# Patient Record
Sex: Male | Born: 2000 | Race: Black or African American | Hispanic: No | Marital: Single
Health system: Southern US, Community
[De-identification: ages and names within clinical notes are randomized; demographics above are authoritative.]

---
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
## 2020-10-15 IMAGING — CT CT PELVIS W/ CM
2 of 3 series · 16 of 46 positions shown, 18 images · IV contrast (OMNIPAQUE 300)
Comparison: None

CLINICAL DATA: Soft tissue mass/abscess along the buttock.

EXAM:
CT PELVIS WITH CONTRAST
TECHNIQUE: Multidetector CT imaging of the pelvis was performed using the
standard protocol following the bolus administration of intravenous
contrast.
CONTRAST:  100mL OMNIPAQUE IOHEXOL 300 MG/ML  SOLN

[Series 3: axial st · axial · 0.72mm/px · z∈[-132,+133]mm · 13 of 61 slices shown, 15 images]
[im 4/61  soft-tissue]
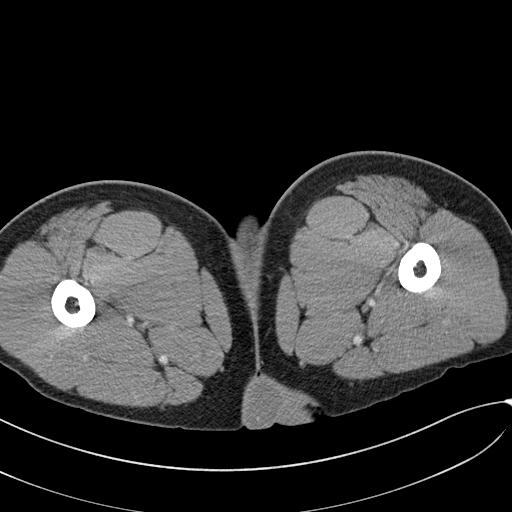
[im 4/61  bone]
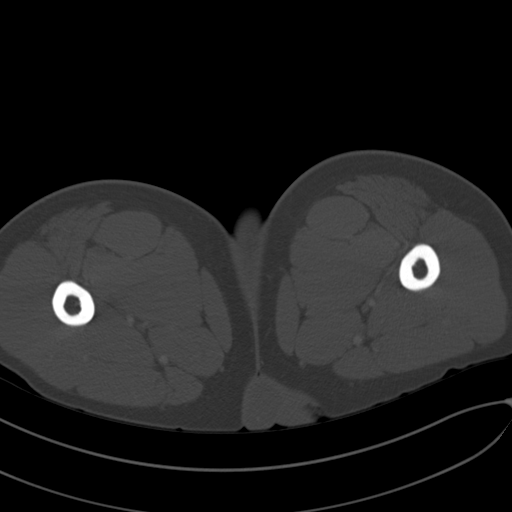
[im 8/61  soft-tissue]
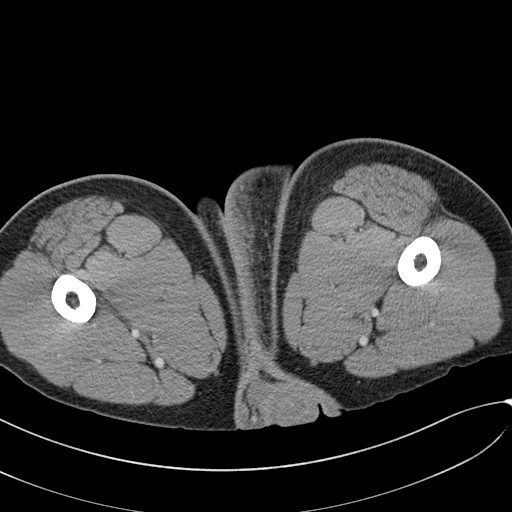
[im 12/61  soft-tissue]
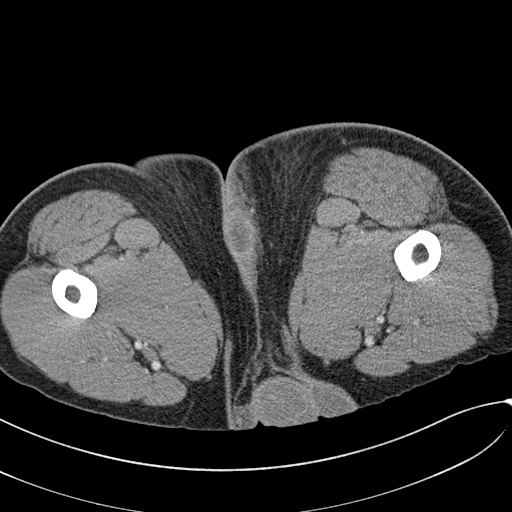
[im 18/61  soft-tissue]
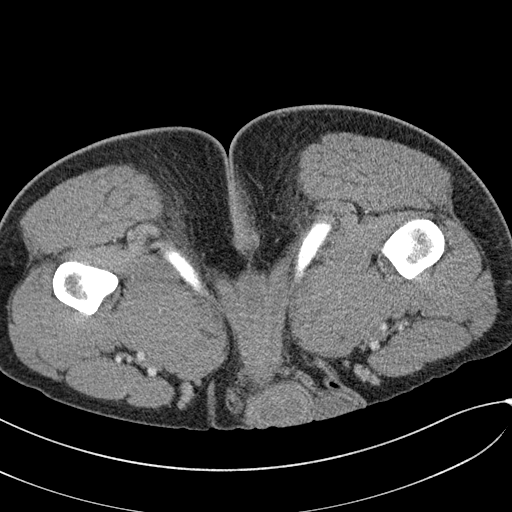
[im 22/61  soft-tissue]
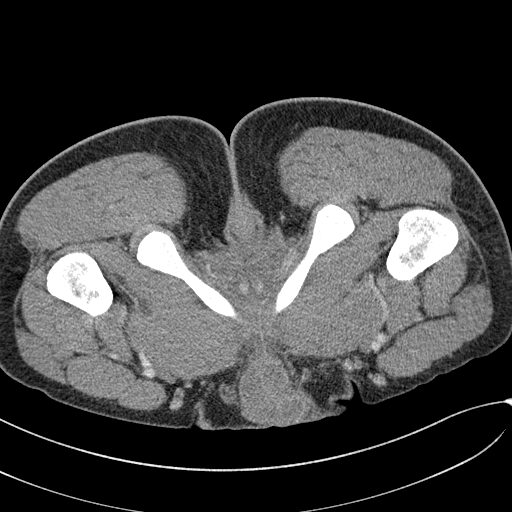
[im 26/61  soft-tissue]
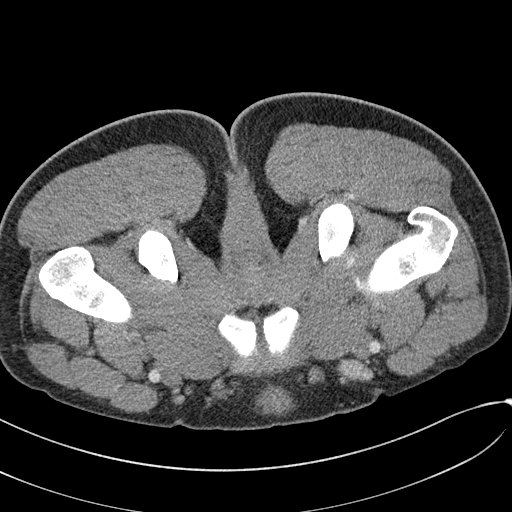
[im 31/61  soft-tissue]
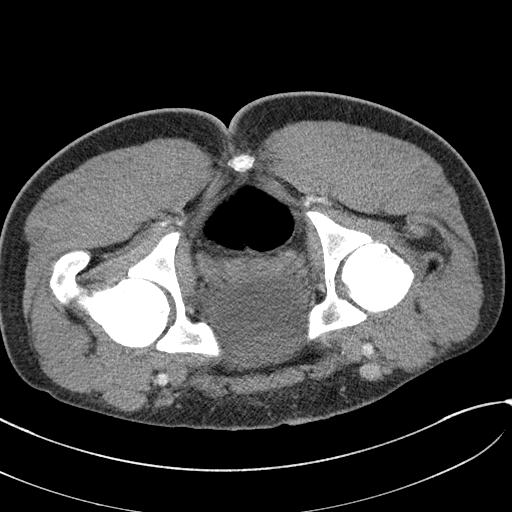
[im 35/61  soft-tissue]
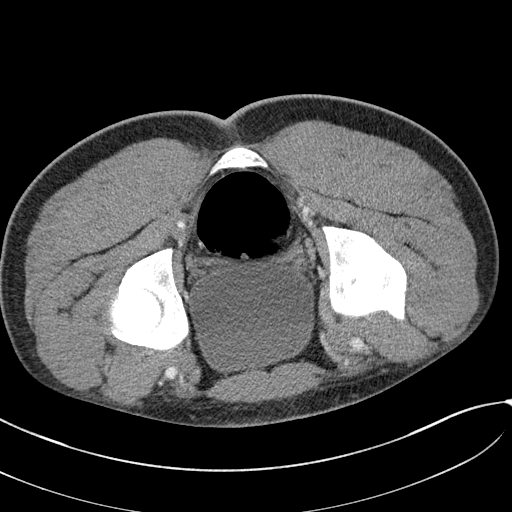
[im 39/61  soft-tissue]
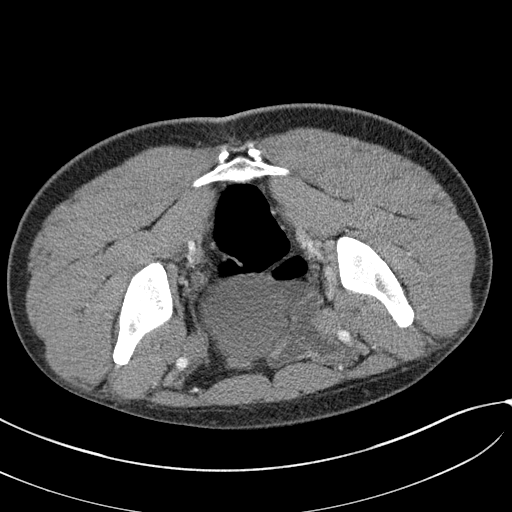
[im 39/61  bone]
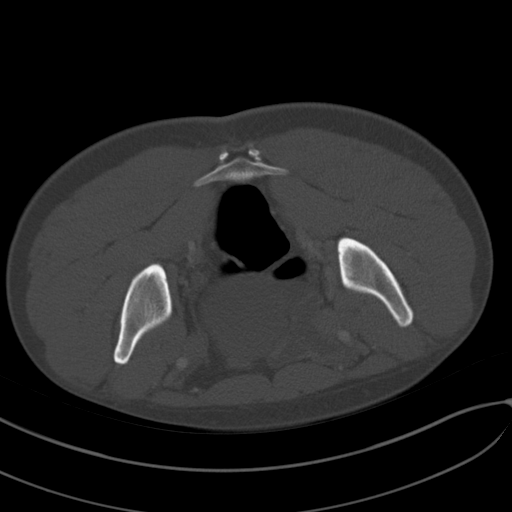
[im 43/61  soft-tissue]
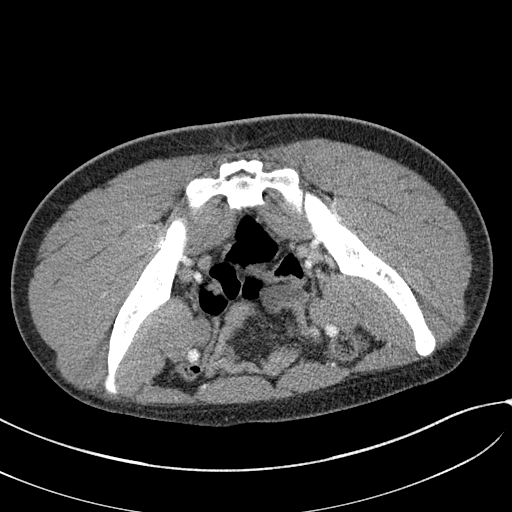
[im 49/61  soft-tissue]
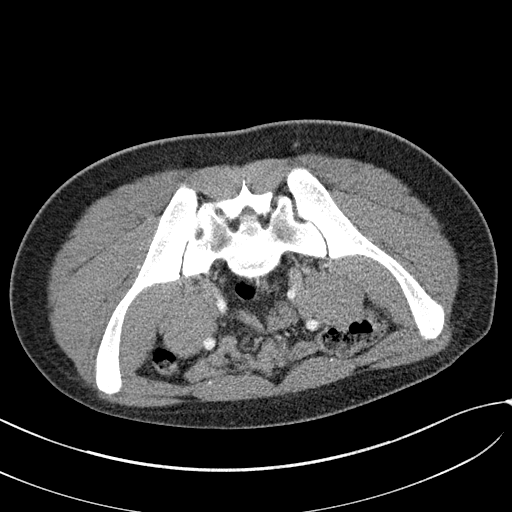
[im 53/61  soft-tissue]
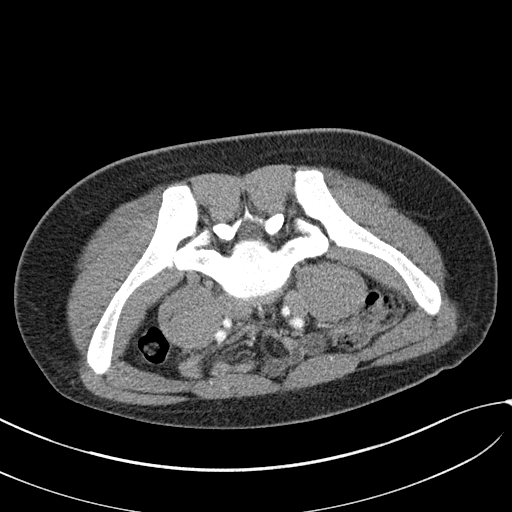
[im 57/61  soft-tissue]
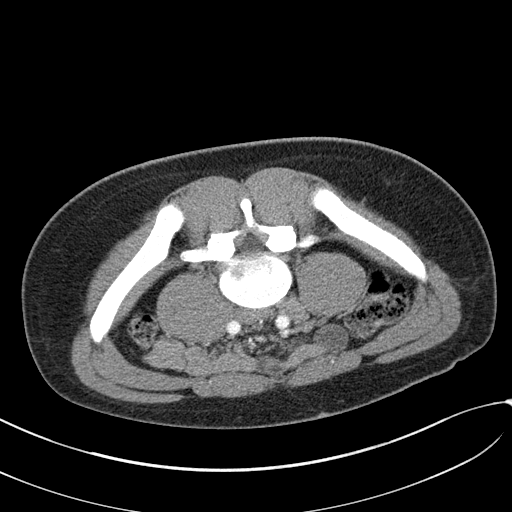

[Series 9: coronal st · coronal · 0.57mm/px · 3 of 125 slices shown]
[im 42/125  soft-tissue]
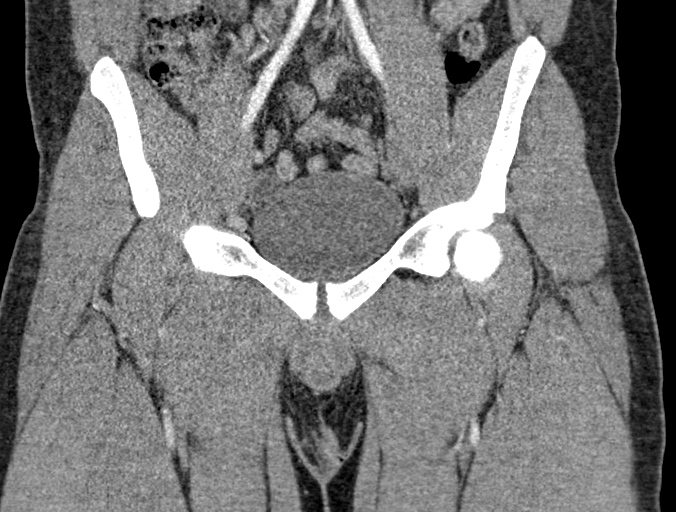
[im 56/125  soft-tissue]
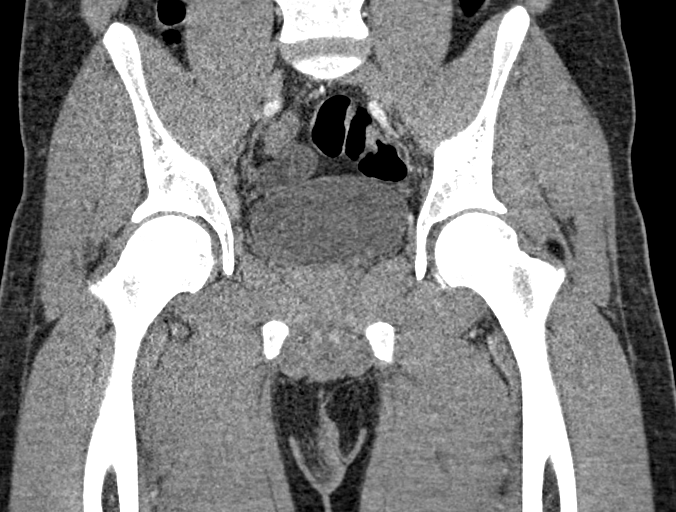
[im 69/125  soft-tissue]
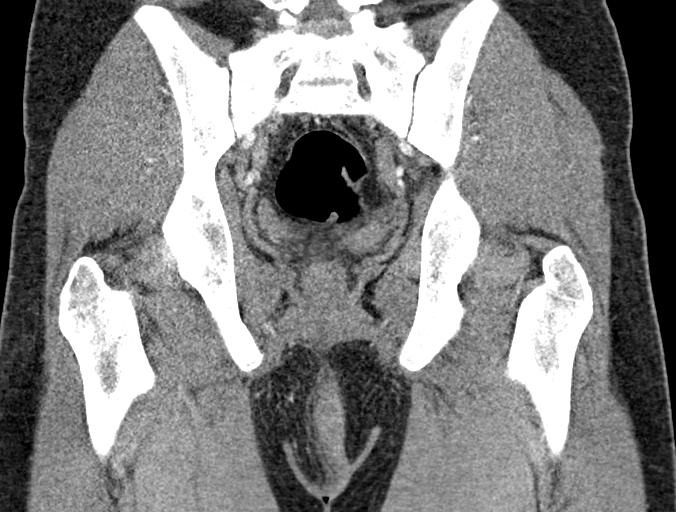

[16 of 46 positions shown; findings below may reference images not displayed]

FINDINGS: Urinary Tract: Unremarkable with respect to visualized urinary
tract, no distal ureteral distension.

Bowel: Visualized loops of bowel in the lower abdomen are normal.
The appendix is seen in the RIGHT lower quadrant also normal.

Perianal stranding and thickening, see below.

Vascular/Lymphatic: Vascular structures in the pelvis are patent. No
evidence of adenopathy.

Reproductive:  Prostate grossly normal.

Stranding extending along the gluteal cleft anteriorly along the
perineum towards the base of the scrotum. Testicles not well
assessed though no gross scrotal fluid collection. No gas in the
soft tissues of the perineum.

Other: Ovoid collection in the RIGHT gluteal cleft 3.4 x 1.9 cm,
density 22 Hounsfield units. Stranding extending towards the base of
the scrotum. Stranding also extends from the perianal region into
the gluteal cleft.

Musculoskeletal: No acute bone finding or destructive bone process.
IMPRESSION: Perianal/gluteal abscess or phlegmon. Stranding extending to the
base of the scrotum and towards the lower margin of the sphincter
complex at the pelvic floor.

No gas in the soft tissues at this time. MRI fistula protocol may be
helpful to assess for perianal fistula formation or sinus tract
